# Patient Record
Sex: Male | Born: 1985 | Race: White | Hispanic: Yes | Marital: Married | Smoking: Never smoker
Health system: Southern US, Community
[De-identification: ages and names within clinical notes are randomized; demographics above are authoritative.]

## PROBLEM LIST (undated history)

## (undated) HISTORY — PX: KNEE SURGERY: SHX244

---

## 2018-02-22 ENCOUNTER — Encounter (HOSPITAL_COMMUNITY): Payer: Self-pay | Admitting: Emergency Medicine

## 2018-02-22 ENCOUNTER — Emergency Department (HOSPITAL_COMMUNITY)
Admission: EM | Admit: 2018-02-22 | Discharge: 2018-02-22 | Disposition: A | Discharge status: Home / Self Care | Payer: Commercial Managed Care - PPO | Attending: Emergency Medicine | Admitting: Emergency Medicine

## 2018-02-22 DIAGNOSIS — R222 Localized swelling, mass and lump, trunk: Secondary | ICD-10-CM | POA: Diagnosis present

## 2018-02-22 DIAGNOSIS — L03317 Cellulitis of buttock: Secondary | ICD-10-CM | POA: Diagnosis not present

## 2018-02-22 MED ORDER — DOCUSATE SODIUM 100 MG PO CAPS: 100.0000 mg | ORAL_CAPSULE | Freq: Two times a day (BID) | ORAL | 0 refills | Status: AC

## 2018-02-22 MED ORDER — CLINDAMYCIN HCL 150 MG PO CAPS: 300.0000 mg | ORAL_CAPSULE | Freq: Three times a day (TID) | ORAL | 0 refills | Status: AC

## 2018-02-22 NOTE — Discharge Instructions (Signed)
La celulitis es una infeccin de la piel. El rea infectada generalmente est de color rojo y duele. Esta afeccin ocurre con ms frecuencia en los brazos y en las piernas. La infeccin puede diseminarse al tejido subyacente, los msculos y la [place], y volverse grave. Es importante realizar un tratamiento para Copyesta afeccin.  Comunquese con un mdico si: Tiene fiebre. Los sntomas no mejoran 1 o 2 [address]  [id number] despus de [address]ar el tratamiento. El hueso o la articulacin que se encuentran por debajo de la zona infectada le duelen despus de que la piel se [place]. La infeccin se repite en la misma zona o en una zona diferente. Tiene un bulto inflamado en la zona infectada. Presenta nuevos sntomas. Se siente enfermo (malestar general) con dolores musculares. Solicite ayuda de inmediato si: Los sntomas empeoran. Se siente muy somnoliento. Tiene vmitos o diarrea que no desaparecen. Observa una lnea roja en la piel que sale desde la zona infectada. El rea roja se extiende o se vuelve de color oscuro.

## 2018-02-22 NOTE — ED Provider Notes (Signed)
[facility]-EMERGENCY DEPT Provider Note   CSN: [id number] Arrival date & time: 02/22/18  1915     History   Chief Complaint Chief Complaint  Patient presents with  . Rectal Pain    HPI Nicholas Valencia is a 32 y.o. male.  Who presents the emergency department chief complaint of buttock pain.  He is a past medical history of constipation.  Patient states that he has some swelling and mild tenderness in the right gluteal tissue.  He states that when he strains the discomfort is worse.  He denies pain on the inside or pain with defecation.  He states that he felt something pop inside his buttock about a week ago and he had some relief but he denies any drainage from his rectum or drainage from the skin.  He denies fevers, chills.  HPI  History reviewed. No pertinent past medical history.  There are no active problems to display for this patient.   Past Surgical History:  Procedure Laterality Date  . KNEE SURGERY Left         Home Medications    Prior to Admission medications   Not on File    Family History No family history on file.  Social History Social History   Tobacco Use  . Smoking status: Never Smoker  . Smokeless tobacco: Never Used  Substance Use Topics  . Alcohol use: Not on file  . Drug use: Not on file     Allergies   Patient has no known allergies.   Review of Systems Review of Systems Ten systems reviewed and are negative for acute change, except as noted in the HPI.    Physical Exam Updated Vital Signs BP 122/82 (BP Location: Right Arm)   Pulse 94   Temp 98.7 F (37.1 C) (Oral)   Resp 16   SpO2 97%   Physical Exam  Constitutional: He appears well-developed and well-nourished. No distress.  HENT:  Head: Normocephalic and atraumatic.  Eyes: Conjunctivae are normal. No scleral icterus.  Neck: Normal range of motion. Neck supple.  Cardiovascular: Normal rate, regular rhythm and normal heart sounds.    Pulmonary/Chest: Effort normal and breath sounds normal. No respiratory distress.  Abdominal: Soft. There is no tenderness.  Genitourinary: Rectal exam shows no external hemorrhoid, no internal hemorrhoid, no fissure, no mass and no tenderness.     Musculoskeletal: He exhibits no edema.  Neurological: He is alert.  Skin: Skin is warm and dry. He is not diaphoretic.  Psychiatric: His behavior is normal.  Nursing note and vitals reviewed.    ED Treatments / Results  Labs (all labs ordered are listed, but only abnormal results are displayed) Labs Reviewed - No data to display  EKG None  Radiology No results found.  Procedures Procedures (including critical care time)  Medications Ordered in ED Medications - No data to display   Initial Impression / Assessment and Plan / ED Course  I have reviewed the triage vital signs and the nursing notes.  Pertinent labs & imaging results that were available during my care of the patient were reviewed by me and considered in my medical decision making (see chart for details).     Patient presentation consistent with cellulitis. Afebrile. No tachycardia, hypotension or other symptoms suggestive of severe infection. Digital rectal exam is benign. Area has been demarcated and pt advised to follow up for wound check in 2-3 days, sooner for worsening systemic symptoms, new lymphangitis, or significant spread of erythema past  line of demarcation. Will discharge with keflex. Return precautions discussed. Pt appears safe for discharge.    Final Clinical Impressions(s) / ED Diagnoses   Final diagnoses:  None    ED Discharge Orders    None       Arthor Captain, PA-C 02/23/18 0017    Rolland Porter, MD 02/25/18 [id number]

## 2018-02-22 NOTE — ED Triage Notes (Signed)
Information given through phone interpreter pt speaks spanish. Patient c/o left buttocks pain. States he felt something pop a few days ago but a bump is there again. Unsure if it is on the inside or outside of rectum. Taking prednisone that he had leftover from previous visit.

## 2018-02-26 ENCOUNTER — Encounter (HOSPITAL_COMMUNITY): Payer: Self-pay | Admitting: Emergency Medicine

## 2018-02-26 ENCOUNTER — Other Ambulatory Visit: Payer: Self-pay

## 2018-02-26 ENCOUNTER — Emergency Department (HOSPITAL_COMMUNITY): Payer: Commercial Managed Care - PPO

## 2018-02-26 ENCOUNTER — Emergency Department (HOSPITAL_COMMUNITY)
Admission: EM | Admit: 2018-02-26 | Discharge: 2018-02-27 | Disposition: A | Discharge status: Home / Self Care | Payer: Commercial Managed Care - PPO | Attending: Emergency Medicine | Admitting: Emergency Medicine

## 2018-02-26 DIAGNOSIS — L03317 Cellulitis of buttock: Secondary | ICD-10-CM | POA: Diagnosis not present

## 2018-02-26 DIAGNOSIS — L0231 Cutaneous abscess of buttock: Secondary | ICD-10-CM | POA: Diagnosis present

## 2018-02-26 LAB — BASIC METABOLIC PANEL
Anion gap: 11 (ref 5–15)
BUN: 8 mg/dL (ref 6–20)
CO2: 23 mmol/L (ref 22–32)
Calcium: 9.3 mg/dL (ref 8.9–10.3)
Chloride: 106 mmol/L (ref 101–111)
Creatinine, Ser: 1.11 mg/dL (ref 0.61–1.24)
GFR calc Af Amer: >60 mL/min (ref >60)
GFR calc non Af Amer: >60 mL/min (ref >60)
Glucose, Bld: 112 mg/dL — ABNORMAL HIGH (ref 65–99)
Potassium: 3.5 mmol/L (ref 3.5–5.1)
Sodium: 140 mmol/L (ref 135–145)

## 2018-02-26 LAB — CBC WITH DIFFERENTIAL/PLATELET
Basophils Absolute: 0 10*3/uL (ref 0.0–0.1)
Basophils Relative: 0 %
Eosinophils Absolute: 0.1 10*3/uL (ref 0.0–0.7)
Eosinophils Relative: 0 %
HCT: 44.2 % (ref 39.0–52.0)
Hemoglobin: 15 g/dL (ref 13.0–17.0)
Lymphocytes Relative: 8 %
Lymphs Abs: 1.3 10*3/uL (ref 0.7–4.0)
MCH: 30.1 pg (ref 26.0–34.0)
MCHC: 33.9 g/dL (ref 30.0–36.0)
MCV: 88.6 fL (ref 78.0–100.0)
Monocytes Absolute: 1.2 10*3/uL — ABNORMAL HIGH (ref 0.1–1.0)
Monocytes Relative: 7 %
Neutro Abs: 14.5 10*3/uL — ABNORMAL HIGH (ref 1.7–7.7)
Neutrophils Relative %: 85 %
Platelets: 301 10*3/uL (ref 150–400)
RBC: 4.99 MIL/uL (ref 4.22–5.81)
RDW: 12.8 % (ref 11.5–15.5)
WBC: 17 10*3/uL — ABNORMAL HIGH (ref 4.0–10.5)

## 2018-02-26 MED ORDER — IOPAMIDOL (ISOVUE-300) INJECTION 61%
INTRAVENOUS | Status: AC
  Filled 2018-02-26: qty 100

## 2018-02-26 MED ORDER — SODIUM CHLORIDE 0.9 % IV SOLN
2000.0000 mg | Freq: Once | INTRAVENOUS | Status: AC
  Administered 2018-02-26: 2000 mg via INTRAVENOUS
  Filled 2018-02-26: qty 2000

## 2018-02-26 MED ORDER — IOPAMIDOL (ISOVUE-300) INJECTION 61%
100.0000 mL | Freq: Once | INTRAVENOUS | Status: AC | PRN
  Administered 2018-02-26: 100 mL via INTRAVENOUS

## 2018-02-26 MED ORDER — HYDROMORPHONE HCL 1 MG/ML IJ SOLN
1.0000 mg | Freq: Once | INTRAMUSCULAR | Status: AC
  Administered 2018-02-26: 1 mg via INTRAVENOUS
  Filled 2018-02-26: qty 1

## 2018-02-26 MED ORDER — ACETAMINOPHEN 325 MG PO TABS
650.0000 mg | ORAL_TABLET | Freq: Once | ORAL | Status: AC
  Administered 2018-02-26: 650 mg via ORAL
  Filled 2018-02-26: qty 2

## 2018-02-26 MED ORDER — KETOROLAC TROMETHAMINE 15 MG/ML IJ SOLN
15.0000 mg | Freq: Once | INTRAMUSCULAR | Status: AC
  Administered 2018-02-26: 15 mg via INTRAVENOUS
  Filled 2018-02-26: qty 1

## 2018-02-26 MED ORDER — SODIUM CHLORIDE 0.9 % IV BOLUS
1000.0000 mL | Freq: Once | INTRAVENOUS | Status: AC
  Administered 2018-02-26: 1000 mL via INTRAVENOUS

## 2018-02-26 MED ORDER — LIDOCAINE HCL (PF) 1 % IJ SOLN
10.0000 mL | Freq: Once | INTRAMUSCULAR | Status: AC
  Administered 2018-02-27: 10 mL via INTRADERMAL
  Filled 2018-02-26: qty 30

## 2018-02-26 NOTE — ED Triage Notes (Signed)
Pt triaged via stratus video interpreter  Pt states he was seen here on Friday and was diagnosed with cellulitis of his buttock  Pt was started on antibiotics  Pt went to his PCP on Monday and had blood work and was told to come back today  Pt went back today and was told to come here for further evaluation  Pt may need IV antibiotics  Pt has redness and swelling noted and the area is hard and hot to touch  Pt states it has spread and the pain has increased  PT states he is unable to sit or stand due to the pain

## 2018-02-26 NOTE — ED Provider Notes (Signed)
[facility]-EMERGENCY DEPT Provider Note   CSN: [id number] Arrival date & time: 02/26/18  1807     History   Chief Complaint Chief Complaint  Patient presents with  . Cellulitis    HPI Nicholas Valencia is a 32 y.o. male.  HPI   32yM presenting for re-evaluation. Was seen in ED on 4/14 and diagnosed with cellulitis of R buttock. Has been on clindamycin. Symptoms have progressed in terms of pain and size of lesion. No drainage. Fever in past 24 hours. Denies abdominal or rectal pain. Otherwise healthy. Spanish interpretor utilized.   History reviewed. No pertinent past medical history.  There are no active problems to display for this patient.   Past Surgical History:  Procedure Laterality Date  . KNEE SURGERY Left         Home Medications    Prior to Admission medications   Medication Sig Start Date End Date Taking? Authorizing Provider  clindamycin (CLEOCIN) 150 MG capsule Take 2 capsules (300 mg total) by mouth 3 (three) times daily. May dispense as 150mg  capsules 02/22/18   Harris, Abigail, PA-C  docusate sodium (COLACE) 100 MG capsule Take 1 capsule (100 mg total) by mouth every 12 (twelve) hours. 02/22/18   Arthor CaptainHarris, Abigail, PA-C    Family History History reviewed. No pertinent family history.  Social History Social History   Tobacco Use  . Smoking status: Never Smoker  . Smokeless tobacco: Never Used  Substance Use Topics  . Alcohol use: Yes    Comment: occ  . Drug use: Never     Allergies   Patient has no known allergies.   Review of Systems Review of Systems All systems reviewed and negative, other than as noted in HPI.   Physical Exam Updated Vital Signs BP 127/81 (BP Location: Left Arm)   Pulse (!) 107   Temp (!) 101.5 F (38.6 C) (Oral)   Resp 18   SpO2 98%   Physical Exam  Constitutional: He appears well-developed and well-nourished. No distress.  HENT:  Head: Normocephalic and atraumatic.  Eyes:  Conjunctivae are normal. Right eye exhibits no discharge. Left eye exhibits no discharge.  Neck: Neck supple.  Cardiovascular: Normal rate, regular rhythm and normal heart sounds. Exam reveals no gallop and no friction rub.  No murmur heard. Pulmonary/Chest: Effort normal and breath sounds normal. No respiratory distress.  Abdominal: Soft. He exhibits no distension. There is no tenderness.  Musculoskeletal: He exhibits no edema or tenderness.  Neurological: He is alert.  Skin: Skin is warm and dry.  Very large abscess to R buttock. Indurated area about the size of a baseball to R buttock. Overlying cellulitic changes. TTP extending medially although skin changes less noticeable. Nontender around rectum. No drainage.   Psychiatric: He has a normal mood and affect. His behavior is normal. Thought content normal.  Nursing note and vitals reviewed.    ED Treatments / Results  Labs (all labs ordered are listed, but only abnormal results are displayed) Labs Reviewed  CBC WITH DIFFERENTIAL/PLATELET - Abnormal; Notable for the following components:      Result Value   WBC 17.0 (*)    Neutro Abs 14.5 (*)    Monocytes Absolute 1.2 (*)    All other components within normal limits  BASIC METABOLIC PANEL - Abnormal; Notable for the following components:   Glucose, Bld 112 (*)    All other components within normal limits    EKG None  Radiology Ct Abdomen Pelvis W Contrast  Result Date: 02/26/2018 CLINICAL DATA:  Acute onset of erythema and swelling at the buttocks. EXAM: CT ABDOMEN AND PELVIS WITH CONTRAST TECHNIQUE: Multidetector CT imaging of the abdomen and pelvis was performed using the standard protocol following bolus administration of intravenous contrast. CONTRAST:  ISOVUE-300 IOPAMIDOL (ISOVUE-300) INJECTION 61% COMPARISON:  None. FINDINGS: Lower chest: The visualized lung bases are grossly clear. The visualized portions of the mediastinum are unremarkable. Hepatobiliary: The  liver is unremarkable in appearance. A large stone is seen within the gallbladder. The gallbladder is otherwise unremarkable. The common bile duct remains normal in caliber. Pancreas: The pancreas is within normal limits. Spleen: The spleen is unremarkable in appearance. Adrenals/Urinary Tract: The adrenal glands are unremarkable in appearance. The kidneys are within normal limits. There is no evidence of hydronephrosis. No renal or ureteral stones are identified. No perinephric stranding is seen. Stomach/Bowel: The stomach is unremarkable in appearance. The small bowel is within normal limits. The appendix is normal in caliber, without evidence of appendicitis. The colon is unremarkable in appearance. Vascular/Lymphatic: The abdominal aorta is unremarkable in appearance. The inferior vena cava is grossly unremarkable. No retroperitoneal lymphadenopathy is seen. No pelvic sidewall lymphadenopathy is identified. Reproductive: The bladder is mildly distended and grossly unremarkable. The prostate remains normal in size. Other: A large collection of subcutaneous fluid is noted to the right side of the gluteal cleft and distal anorectal canal, measuring approximately 6.5 x 5.0 x 3.6 cm, with a small 1.7 cm more defined abscess seen more superficially. Surrounding soft tissue inflammation is noted. Musculoskeletal: No acute osseous abnormalities are identified. The visualized musculature is unremarkable in appearance. IMPRESSION: 1. Large collection of subcutaneous fluid to the right side of the gluteal cleft and distal anorectal canal, measuring 6.5 x 5.0 x 3.6 cm, with a small 1.7 cm more defined abscess seen more superficially. Surrounding soft tissue inflammation noted. 2. Cholelithiasis.  Gallbladder otherwise unremarkable. Electronically Signed   By: Roanna Raider M.D.   On: 02/26/2018 23:54    Procedures Procedures (including critical care time)  INCISION AND DRAINAGE Performed by: Revonda Humphrey Consent: Verbal consent obtained. Risks and benefits: risks, benefits and alternatives were discussed Type: abscess  Body area: R buttock  Anesthesia: local infiltration  Abscess cavity localized with 18g needle and then incision made following needle with 11 blade. Copious amount of pus drained. Loculations broken up with hemostat.   Local anesthetic: lidocaine 1% w/o epinephrine  Anesthetic total: 5 ml  Complexity: complex  Drainage: purulent  Drainage amount: large. Foul smelling.   Packing material: 1" in iodoform gauze   Patient tolerance: Patient tolerated the procedure well with no immediate complications.       Medications Ordered in ED Medications  vancomycin (VANCOCIN) 2,000 mg in sodium chloride 0.9 % 500 mL IVPB (has no administration in time range)  HYDROmorphone (DILAUDID) injection 1 mg (has no administration in time range)  ketorolac (TORADOL) 15 MG/ML injection 15 mg (has no administration in time range)  sodium chloride 0.9 % bolus 1,000 mL (has no administration in time range)  acetaminophen (TYLENOL) tablet 650 mg (has no administration in time range)     Initial Impression / Assessment and Plan / ED Course  I have reviewed the triage vital signs and the nursing notes.  Pertinent labs & imaging results that were available during my care of the patient were reviewed by me and considered in my medical decision making (see chart for details).    32yM with abscess  to R buttock. Clinically doesn't appear to extend to rectum but close. Very large in size. Might be more appropriate to open in OR. Will CT to evaluate extent. Basic labs, abx, pain meds.   Curbsided Dr Daphine Deutscher, surgery in the ED. He drained it under local at bedside. Continue clindamycin. Advised to stop keflex. Wound care/sitz baths. Return precautions discussed. Continue ibuprofen as needed for pain. Continued care and return precautions discussed via translator.   Final Clinical  Impressions(s) / ED Diagnoses   Final diagnoses:  Abscess of buttock, right  Cellulitis of buttock    ED Discharge Orders    None       Raeford Razor, MD 03/01/18 1002

## 2018-02-26 NOTE — ED Notes (Signed)
I and D tray and Lidocaine at bedside ready for provider.

## 2018-02-27 MED ORDER — HYDROGEN PEROXIDE 3 % EX SOLN
CUTANEOUS | Status: AC
  Filled 2018-02-27: qty 473

## 2018-02-27 NOTE — Discharge Instructions (Addendum)
I actually want you to continue your current antibiotic (clindamycin) until finished. Take 400-600mg  every 6 hours as needed for pain. Removed packing after 1 day if still present. Just grab the edge of it and gently pull it out.

## 2019-12-23 IMAGING — CT CT ABD-PELV W/ CM
2 of 4 series · 15 of 46 positions shown, 17 images · IV contrast (ISOVUE)
Comparison: None.

CLINICAL DATA: Acute onset of erythema and swelling at the
buttocks.

EXAM:
CT ABDOMEN AND PELVIS WITH CONTRAST
TECHNIQUE: Multidetector CT imaging of the abdomen and pelvis was performed
using the standard protocol following bolus administration of
intravenous contrast.
CONTRAST:  100mL S3MFUP-6RR IOPAMIDOL (S3MFUP-6RR) INJECTION 61%

[Series 2: axial st · axial · 0.75mm/px · z∈[+32,+537]mm · 12 of 111 slices shown, 14 images]
[im 5/111  soft-tissue]
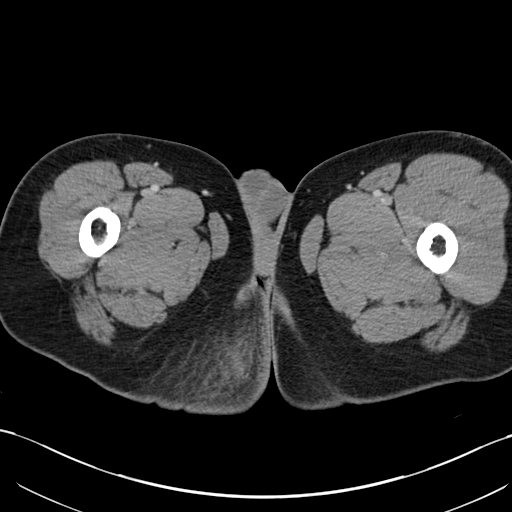
[im 5/111  bone]
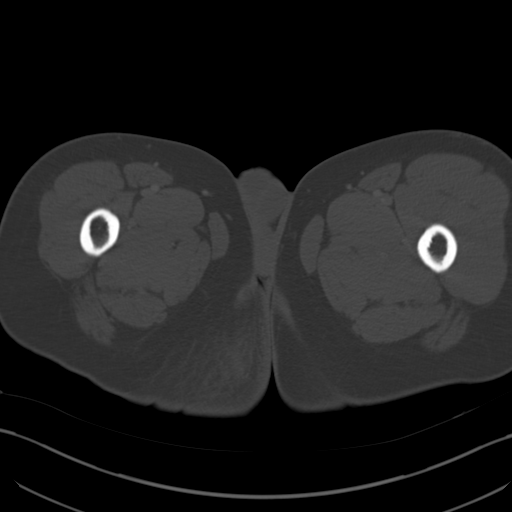
[im 15/111  soft-tissue]
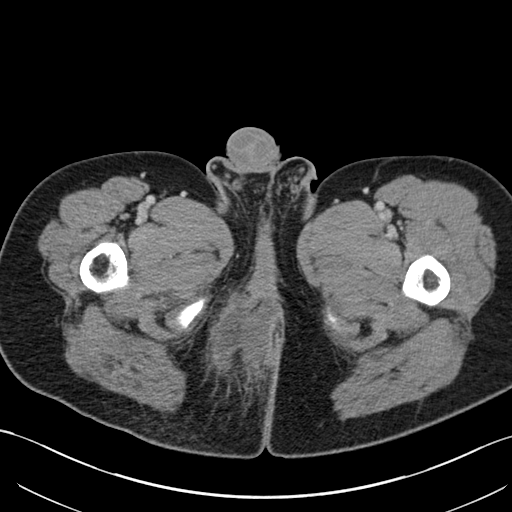
[im 24/111  soft-tissue]
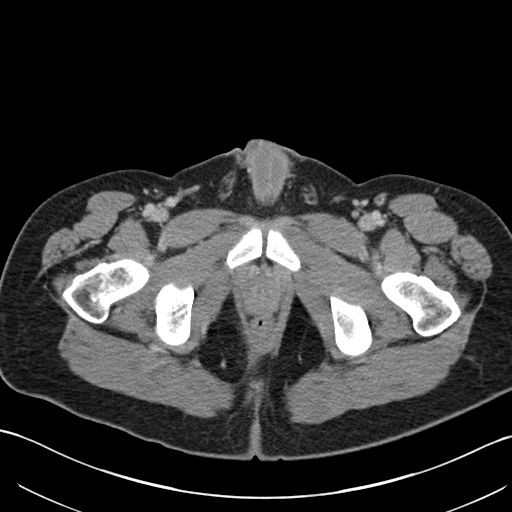
[im 34/111  soft-tissue]
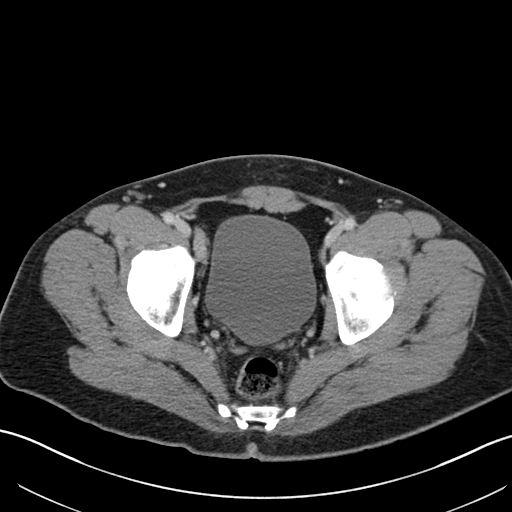
[im 44/111  soft-tissue]
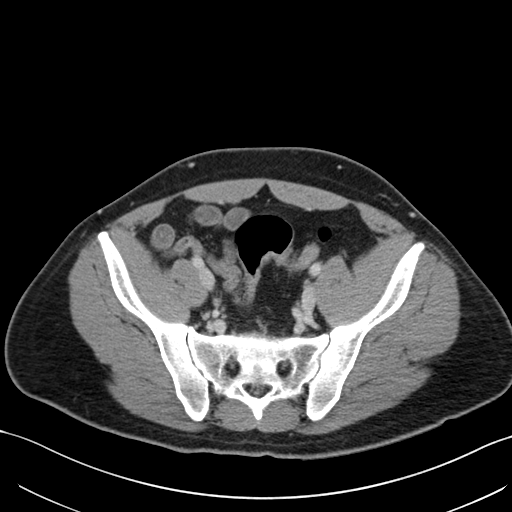
[im 53/111  soft-tissue]
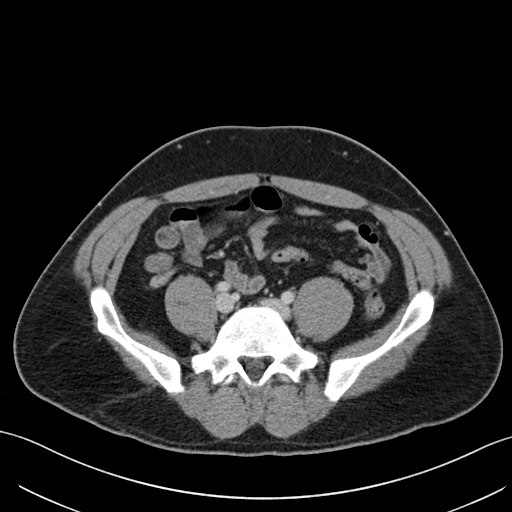
[im 58/111  soft-tissue]
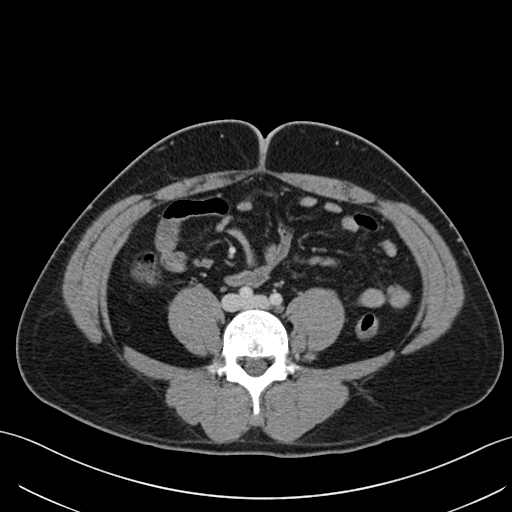
[im 67/111  soft-tissue]
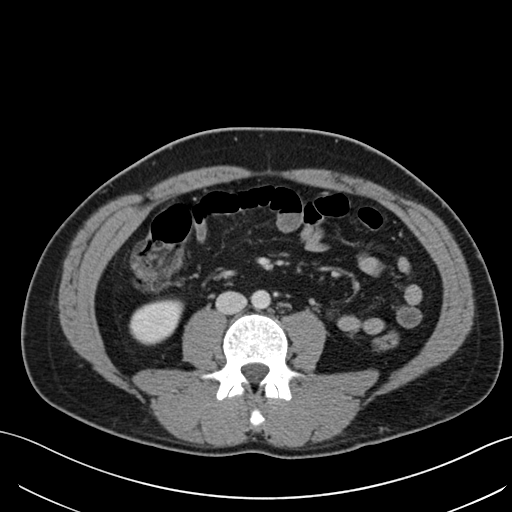
[im 77/111  soft-tissue]
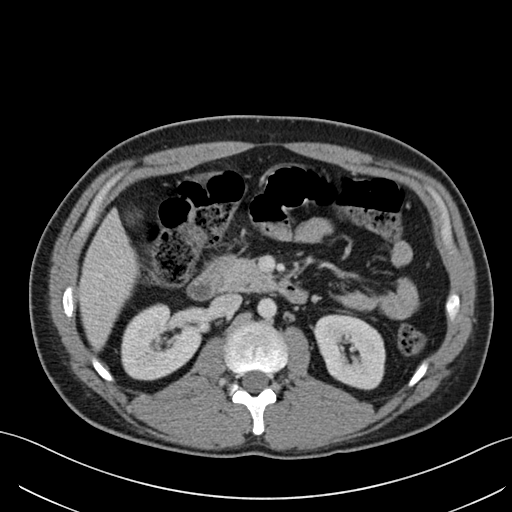
[im 77/111  bone]
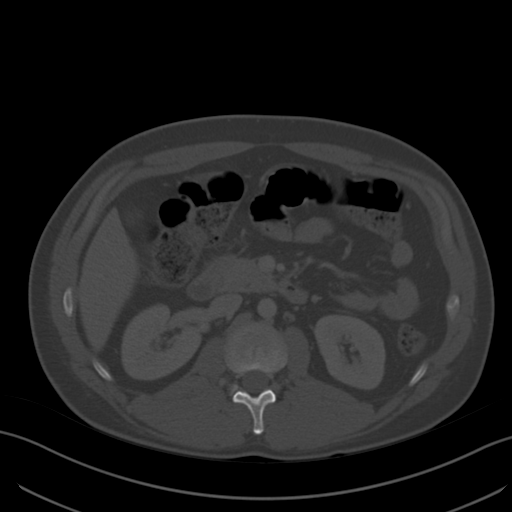
[im 87/111  soft-tissue]
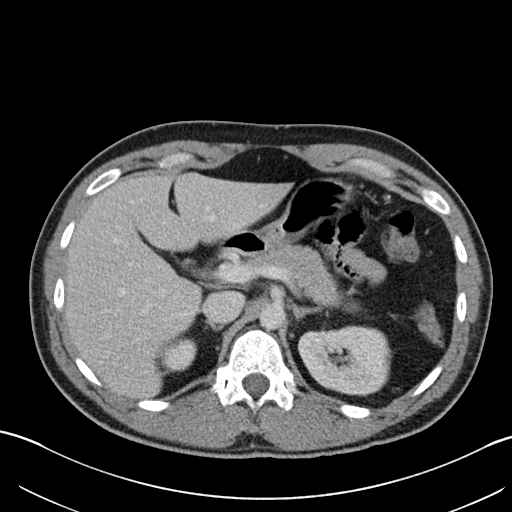
[im 96/111  soft-tissue]
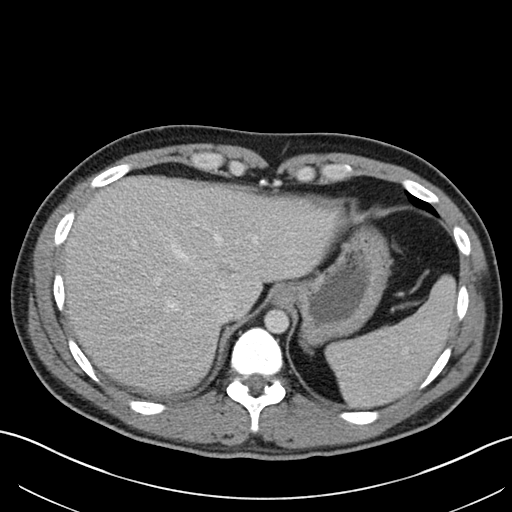
[im 106/111  soft-tissue]
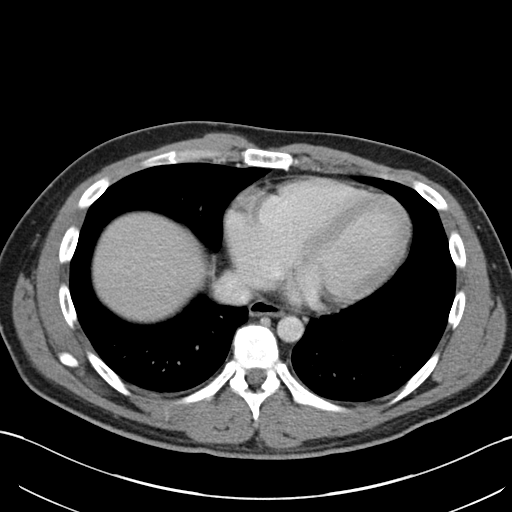

[Series 4: coronal st · coronal · 0.71mm/px · 3 of 78 slices shown]
[im 26/78  soft-tissue]
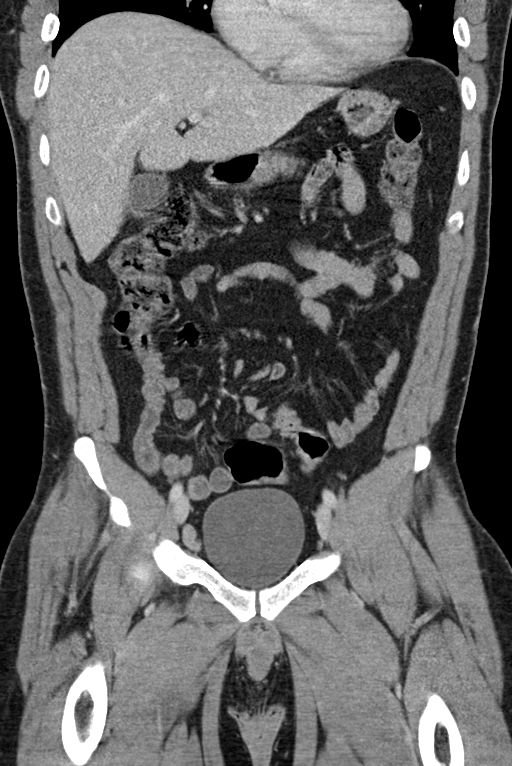
[im 35/78  soft-tissue]
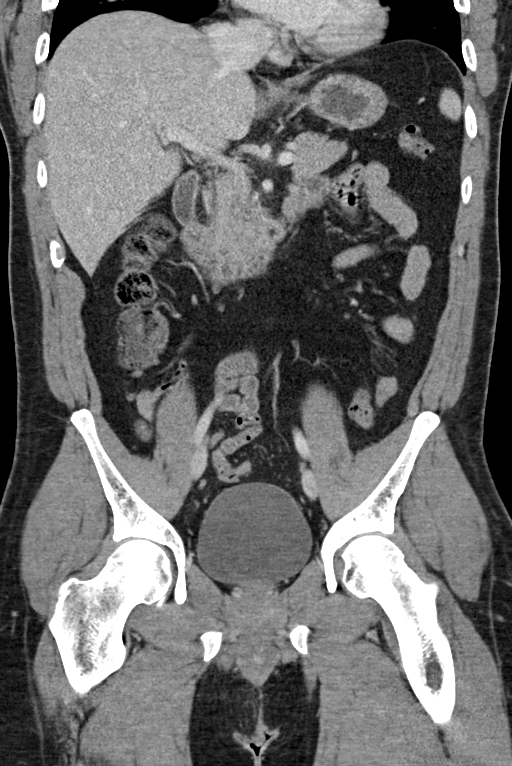
[im 43/78  soft-tissue]
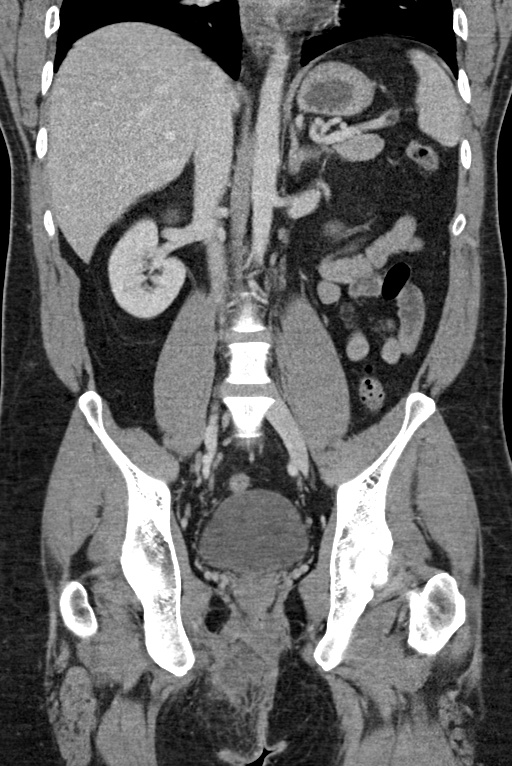

[15 of 46 positions shown; findings below may reference images not displayed]

FINDINGS: Lower chest: The visualized lung bases are grossly clear. The
visualized portions of the mediastinum are unremarkable.

Hepatobiliary: The liver is unremarkable in appearance. A large
stone is seen within the gallbladder. The gallbladder is otherwise
unremarkable. The common bile duct remains normal in caliber.

Pancreas: The pancreas is within normal limits.

Spleen: The spleen is unremarkable in appearance.

Adrenals/Urinary Tract: The adrenal glands are unremarkable in
appearance. The kidneys are within normal limits. There is no
evidence of hydronephrosis. No renal or ureteral stones are
identified. No perinephric stranding is seen.

Stomach/Bowel: The stomach is unremarkable in appearance. The small
bowel is within normal limits. The appendix is normal in caliber,
without evidence of appendicitis. The colon is unremarkable in
appearance.

Vascular/Lymphatic: The abdominal aorta is unremarkable in
appearance. The inferior vena cava is grossly unremarkable. No
retroperitoneal lymphadenopathy is seen. No pelvic sidewall
lymphadenopathy is identified.

Reproductive: The bladder is mildly distended and grossly
unremarkable. The prostate remains normal in size.

Other: A large collection of subcutaneous fluid is noted to the
right side of the gluteal cleft and distal anorectal canal,
measuring approximately 6.5 x 5.0 x 3.6 cm, with a small 1.7 cm more
defined abscess seen more superficially. Surrounding soft tissue
inflammation is noted.

Musculoskeletal: No acute osseous abnormalities are identified. The
visualized musculature is unremarkable in appearance.
IMPRESSION: 1. Large collection of subcutaneous fluid to the right side of the
gluteal cleft and distal anorectal canal, measuring 6.5 x 5.0 x
cm, with a small 1.7 cm more defined abscess seen more
superficially. Surrounding soft tissue inflammation noted.
2. Cholelithiasis.  Gallbladder otherwise unremarkable.
